# Patient Record
Sex: Male | Born: 1945 | Race: Black or African American | Hispanic: No | Marital: Single | Smoking: Former smoker
Health system: Southern US, Community
[De-identification: ages and names within clinical notes are randomized; demographics above are authoritative.]

## PROBLEM LIST (undated history)

## (undated) DIAGNOSIS — I10 Essential (primary) hypertension: Secondary | ICD-10-CM

## (undated) DIAGNOSIS — D72829 Elevated white blood cell count, unspecified: Secondary | ICD-10-CM

## (undated) DIAGNOSIS — W3400XA Accidental discharge from unspecified firearms or gun, initial encounter: Secondary | ICD-10-CM

## (undated) DIAGNOSIS — B1920 Unspecified viral hepatitis C without hepatic coma: Secondary | ICD-10-CM

## (undated) HISTORY — DX: Accidental discharge from unspecified firearms or gun, initial encounter: W34.00XA

## (undated) HISTORY — DX: Unspecified viral hepatitis C without hepatic coma: B19.20

## (undated) HISTORY — DX: Elevated white blood cell count, unspecified: D72.829

## (undated) HISTORY — DX: Essential (primary) hypertension: I10

---

## 1974-07-14 DIAGNOSIS — W3400XA Accidental discharge from unspecified firearms or gun, initial encounter: Secondary | ICD-10-CM

## 1974-07-14 HISTORY — DX: Accidental discharge from unspecified firearms or gun, initial encounter: W34.00XA

## 2003-07-15 DIAGNOSIS — I10 Essential (primary) hypertension: Secondary | ICD-10-CM | POA: Insufficient documentation

## 2003-07-15 DIAGNOSIS — B1920 Unspecified viral hepatitis C without hepatic coma: Secondary | ICD-10-CM

## 2003-07-15 HISTORY — DX: Unspecified viral hepatitis C without hepatic coma: B19.20

## 2004-09-05 DIAGNOSIS — B182 Chronic viral hepatitis C: Secondary | ICD-10-CM | POA: Insufficient documentation

## 2008-07-23 ENCOUNTER — Emergency Department (HOSPITAL_COMMUNITY): Admission: EM | Admit: 2008-07-23 | Discharge: 2008-07-23 | Payer: Self-pay | Admitting: Family Medicine

## 2008-10-09 ENCOUNTER — Ambulatory Visit: Payer: Self-pay | Admitting: Nurse Practitioner

## 2008-10-09 DIAGNOSIS — F17200 Nicotine dependence, unspecified, uncomplicated: Secondary | ICD-10-CM

## 2008-10-10 ENCOUNTER — Encounter (INDEPENDENT_AMBULATORY_CARE_PROVIDER_SITE_OTHER): Payer: Self-pay | Admitting: Nurse Practitioner

## 2008-10-10 LAB — CONVERTED CEMR LAB
ALT: 39 units/L (ref 0–53)
AST: 43 units/L — ABNORMAL HIGH (ref 0–37)
Alkaline Phosphatase: 62 units/L (ref 39–117)
BUN: 14 mg/dL (ref 6–23)
Basophils Absolute: 0 10*3/uL (ref 0.0–0.1)
Creatinine, Ser: 1.1 mg/dL (ref 0.40–1.50)
Eosinophils Absolute: 0.1 10*3/uL (ref 0.0–0.7)
Eosinophils Relative: 0 % (ref 0–5)
HCT: 45.8 % (ref 39.0–52.0)
HCV Ab: REACTIVE — AB
HDL: 45 mg/dL (ref >39)
Hep A Total Ab: POSITIVE — AB
Hep B E Ab: POSITIVE — AB
LDL Cholesterol: 96 mg/dL (ref 0–99)
Lymphs Abs: 3.4 10*3/uL (ref 0.7–4.0)
MCV: 97 fL (ref 78.0–100.0)
Neutrophils Relative %: 61 % (ref 43–77)
Platelets: 296 10*3/uL (ref 150–400)
RDW: 13 % (ref 11.5–15.5)
RPR Titer: 1:2 {titer}
TSH: 0.914 microintl units/mL (ref 0.350–4.500)
Total Bilirubin: 1.1 mg/dL (ref 0.3–1.2)
Total CHOL/HDL Ratio: 3.7
VLDL: 24 mg/dL (ref 0–40)
WBC: 11.4 10*3/uL — ABNORMAL HIGH (ref 4.0–10.5)

## 2008-10-11 ENCOUNTER — Ambulatory Visit: Payer: Self-pay | Admitting: Nurse Practitioner

## 2008-10-11 DIAGNOSIS — A530 Latent syphilis, unspecified as early or late: Secondary | ICD-10-CM | POA: Insufficient documentation

## 2008-11-06 ENCOUNTER — Ambulatory Visit: Payer: Self-pay | Admitting: Nurse Practitioner

## 2008-11-06 LAB — CONVERTED CEMR LAB
Bilirubin Urine: NEGATIVE
Glucose, Urine, Semiquant: NEGATIVE
Specific Gravity, Urine: 1.015
Urobilinogen, UA: 0.2

## 2008-11-07 ENCOUNTER — Encounter (INDEPENDENT_AMBULATORY_CARE_PROVIDER_SITE_OTHER): Payer: Self-pay | Admitting: Nurse Practitioner

## 2008-11-08 ENCOUNTER — Ambulatory Visit (HOSPITAL_COMMUNITY): Admission: RE | Admit: 2008-11-08 | Discharge: 2008-11-08 | Payer: Self-pay | Admitting: *Deleted

## 2008-11-08 ENCOUNTER — Encounter (INDEPENDENT_AMBULATORY_CARE_PROVIDER_SITE_OTHER): Payer: Self-pay | Admitting: Nurse Practitioner

## 2008-11-09 LAB — CONVERTED CEMR LAB: Prothrombin Time: 14.3 s (ref 11.6–15.2)

## 2008-11-14 ENCOUNTER — Encounter (INDEPENDENT_AMBULATORY_CARE_PROVIDER_SITE_OTHER): Payer: Self-pay | Admitting: Nurse Practitioner

## 2008-11-21 ENCOUNTER — Telehealth (INDEPENDENT_AMBULATORY_CARE_PROVIDER_SITE_OTHER): Payer: Self-pay | Admitting: Nurse Practitioner

## 2008-12-06 ENCOUNTER — Ambulatory Visit: Payer: Self-pay | Admitting: Nurse Practitioner

## 2008-12-07 ENCOUNTER — Encounter (INDEPENDENT_AMBULATORY_CARE_PROVIDER_SITE_OTHER): Payer: Self-pay | Admitting: Nurse Practitioner

## 2008-12-14 ENCOUNTER — Encounter (INDEPENDENT_AMBULATORY_CARE_PROVIDER_SITE_OTHER): Payer: Self-pay | Admitting: Nurse Practitioner

## 2008-12-14 ENCOUNTER — Ambulatory Visit: Payer: Self-pay | Admitting: Gastroenterology

## 2009-01-16 ENCOUNTER — Telehealth (INDEPENDENT_AMBULATORY_CARE_PROVIDER_SITE_OTHER): Payer: Self-pay | Admitting: Nurse Practitioner

## 2009-01-25 ENCOUNTER — Encounter (INDEPENDENT_AMBULATORY_CARE_PROVIDER_SITE_OTHER): Payer: Self-pay | Admitting: Nurse Practitioner

## 2009-01-25 ENCOUNTER — Ambulatory Visit: Payer: Self-pay | Admitting: Gastroenterology

## 2009-01-26 ENCOUNTER — Encounter (INDEPENDENT_AMBULATORY_CARE_PROVIDER_SITE_OTHER): Payer: Self-pay | Admitting: Nurse Practitioner

## 2009-02-07 ENCOUNTER — Encounter (INDEPENDENT_AMBULATORY_CARE_PROVIDER_SITE_OTHER): Payer: Self-pay | Admitting: Nurse Practitioner

## 2009-04-09 ENCOUNTER — Encounter (INDEPENDENT_AMBULATORY_CARE_PROVIDER_SITE_OTHER): Payer: Self-pay | Admitting: Nurse Practitioner

## 2009-04-13 ENCOUNTER — Ambulatory Visit: Payer: Self-pay | Admitting: Nurse Practitioner

## 2009-09-21 ENCOUNTER — Ambulatory Visit: Payer: Self-pay | Admitting: Nurse Practitioner

## 2009-09-24 LAB — CONVERTED CEMR LAB
BUN: 13 mg/dL (ref 6–23)
Basophils Relative: 0 % (ref 0–1)
CO2: 23 meq/L (ref 19–32)
Calcium: 9.9 mg/dL (ref 8.4–10.5)
Chloride: 98 meq/L (ref 96–112)
Cholesterol: 148 mg/dL (ref 0–200)
Creatinine, Ser: 1.11 mg/dL (ref 0.40–1.50)
Eosinophils Absolute: 0 10*3/uL (ref 0.0–0.7)
Eosinophils Relative: 0 % (ref 0–5)
HCT: 44.8 % (ref 39.0–52.0)
HDL: 43 mg/dL (ref >39)
Lymphs Abs: 3 10*3/uL (ref 0.7–4.0)
MCHC: 34.6 g/dL (ref 30.0–36.0)
MCV: 97 fL (ref 78.0–100.0)
Monocytes Relative: 7 % (ref 3–12)
Platelets: 286 10*3/uL (ref 150–400)
RBC: 4.62 M/uL (ref 4.22–5.81)
Total CHOL/HDL Ratio: 3.4
Triglycerides: 84 mg/dL (ref <150)
WBC: 11.4 10*3/uL — ABNORMAL HIGH (ref 4.0–10.5)

## 2009-09-25 ENCOUNTER — Ambulatory Visit: Payer: Self-pay | Admitting: Nurse Practitioner

## 2009-10-03 ENCOUNTER — Telehealth (INDEPENDENT_AMBULATORY_CARE_PROVIDER_SITE_OTHER): Payer: Self-pay | Admitting: Nurse Practitioner

## 2009-10-08 ENCOUNTER — Encounter (INDEPENDENT_AMBULATORY_CARE_PROVIDER_SITE_OTHER): Payer: Self-pay | Admitting: Nurse Practitioner

## 2009-11-07 ENCOUNTER — Ambulatory Visit: Payer: Self-pay | Admitting: Nurse Practitioner

## 2009-11-13 DIAGNOSIS — D72829 Elevated white blood cell count, unspecified: Secondary | ICD-10-CM | POA: Insufficient documentation

## 2009-11-13 LAB — CONVERTED CEMR LAB
Basophils Relative: 0 % (ref 0–1)
Eosinophils Absolute: 0 10*3/uL (ref 0.0–0.7)
HCT: 42.6 % (ref 39.0–52.0)
Hemoglobin: 14.1 g/dL (ref 13.0–17.0)
MCHC: 33.1 g/dL (ref 30.0–36.0)
MCV: 101.4 fL — ABNORMAL HIGH (ref 78.0–100.0)
Monocytes Absolute: 1 10*3/uL (ref 0.1–1.0)
Monocytes Relative: 8 % (ref 3–12)
Neutro Abs: 7.5 10*3/uL (ref 1.7–7.7)
RBC: 4.2 M/uL — ABNORMAL LOW (ref 4.22–5.81)

## 2009-11-14 ENCOUNTER — Telehealth (INDEPENDENT_AMBULATORY_CARE_PROVIDER_SITE_OTHER): Payer: Self-pay | Admitting: Nurse Practitioner

## 2009-11-19 ENCOUNTER — Ambulatory Visit: Payer: Self-pay | Admitting: Oncology

## 2009-11-22 ENCOUNTER — Encounter (INDEPENDENT_AMBULATORY_CARE_PROVIDER_SITE_OTHER): Payer: Self-pay | Admitting: Nurse Practitioner

## 2009-12-03 ENCOUNTER — Ambulatory Visit (HOSPITAL_COMMUNITY): Admission: RE | Admit: 2009-12-03 | Discharge: 2009-12-03 | Payer: Self-pay | Admitting: Oncology

## 2009-12-03 ENCOUNTER — Encounter (INDEPENDENT_AMBULATORY_CARE_PROVIDER_SITE_OTHER): Payer: Self-pay | Admitting: Nurse Practitioner

## 2009-12-03 LAB — CBC WITH DIFFERENTIAL/PLATELET
Basophils Absolute: 0 10*3/uL (ref 0.0–0.1)
EOS%: 0.4 % (ref 0.0–7.0)
Eosinophils Absolute: 0 10*3/uL (ref 0.0–0.5)
HCT: 39.8 % (ref 38.4–49.9)
HGB: 14.1 g/dL (ref 13.0–17.1)
MCH: 34.4 pg — ABNORMAL HIGH (ref 27.2–33.4)
NEUT#: 8.4 10*3/uL — ABNORMAL HIGH (ref 1.5–6.5)
NEUT%: 70.4 % (ref 39.0–75.0)
RDW: 12.4 % (ref 11.0–14.6)
lymph#: 2.7 10*3/uL (ref 0.9–3.3)

## 2009-12-03 LAB — COMPREHENSIVE METABOLIC PANEL
ALT: 35 U/L (ref 0–53)
AST: 37 U/L (ref 0–37)
Albumin: 4 g/dL (ref 3.5–5.2)
Alkaline Phosphatase: 54 U/L (ref 39–117)
Potassium: 3.7 mEq/L (ref 3.5–5.3)
Sodium: 134 mEq/L — ABNORMAL LOW (ref 135–145)
Total Bilirubin: 0.7 mg/dL (ref 0.3–1.2)
Total Protein: 8 g/dL (ref 6.0–8.3)

## 2009-12-03 LAB — MORPHOLOGY: PLT EST: ADEQUATE

## 2009-12-03 LAB — CHCC SMEAR

## 2009-12-03 LAB — SEDIMENTATION RATE: Sed Rate: 17 mm/hr — ABNORMAL HIGH (ref 0–16)

## 2009-12-07 ENCOUNTER — Ambulatory Visit (HOSPITAL_COMMUNITY): Admission: RE | Admit: 2009-12-07 | Discharge: 2009-12-07 | Payer: Self-pay | Admitting: Oncology

## 2009-12-20 ENCOUNTER — Encounter (INDEPENDENT_AMBULATORY_CARE_PROVIDER_SITE_OTHER): Payer: Self-pay | Admitting: Nurse Practitioner

## 2010-01-01 ENCOUNTER — Ambulatory Visit: Payer: Self-pay | Admitting: Oncology

## 2010-01-03 LAB — CBC WITH DIFFERENTIAL/PLATELET
BASO%: 0.4 % (ref 0.0–2.0)
Basophils Absolute: 0 10*3/uL (ref 0.0–0.1)
HCT: 39.4 % (ref 38.4–49.9)
HGB: 13.8 g/dL (ref 13.0–17.1)
MCHC: 35.1 g/dL (ref 32.0–36.0)
MONO#: 1 10*3/uL — ABNORMAL HIGH (ref 0.1–0.9)
NEUT%: 63.3 % (ref 39.0–75.0)
RDW: 13.1 % (ref 11.0–14.6)
WBC: 10.3 10*3/uL (ref 4.0–10.3)
lymph#: 2.7 10*3/uL (ref 0.9–3.3)

## 2010-02-01 ENCOUNTER — Ambulatory Visit: Payer: Self-pay | Admitting: Oncology

## 2010-02-01 LAB — CBC WITH DIFFERENTIAL/PLATELET
BASO%: 0.3 % (ref 0.0–2.0)
EOS%: 0.5 % (ref 0.0–7.0)
MCHC: 35.2 g/dL (ref 32.0–36.0)
MONO#: 1 10*3/uL — ABNORMAL HIGH (ref 0.1–0.9)
RBC: 4 10*6/uL — ABNORMAL LOW (ref 4.20–5.82)
WBC: 10 10*3/uL (ref 4.0–10.3)
lymph#: 2.6 10*3/uL (ref 0.9–3.3)

## 2010-02-18 ENCOUNTER — Telehealth (INDEPENDENT_AMBULATORY_CARE_PROVIDER_SITE_OTHER): Payer: Self-pay | Admitting: Nurse Practitioner

## 2010-03-06 ENCOUNTER — Ambulatory Visit: Payer: Self-pay | Admitting: Oncology

## 2010-03-08 ENCOUNTER — Encounter (INDEPENDENT_AMBULATORY_CARE_PROVIDER_SITE_OTHER): Payer: Self-pay | Admitting: Nurse Practitioner

## 2010-03-08 LAB — CBC WITH DIFFERENTIAL/PLATELET
BASO%: 0.2 % (ref 0.0–2.0)
HCT: 39.9 % (ref 38.4–49.9)
LYMPH%: 29.9 % (ref 14.0–49.0)
MCHC: 35.8 g/dL (ref 32.0–36.0)
MONO#: 0.8 10*3/uL (ref 0.1–0.9)
NEUT%: 62.1 % (ref 39.0–75.0)
Platelets: 224 10*3/uL (ref 140–400)
WBC: 11.2 10*3/uL — ABNORMAL HIGH (ref 4.0–10.3)

## 2010-03-09 LAB — COMPREHENSIVE METABOLIC PANEL
Albumin: 4.1 g/dL (ref 3.5–5.2)
BUN: 17 mg/dL (ref 6–23)
CO2: 25 mEq/L (ref 19–32)
Calcium: 9.6 mg/dL (ref 8.4–10.5)
Chloride: 100 mEq/L (ref 96–112)
Glucose, Bld: 135 mg/dL — ABNORMAL HIGH (ref 70–99)
Potassium: 4.1 mEq/L (ref 3.5–5.3)

## 2010-03-09 LAB — C-REACTIVE PROTEIN: CRP: 0 mg/dL (ref <0.6)

## 2010-03-09 LAB — SEDIMENTATION RATE: Sed Rate: 22 mm/hr — ABNORMAL HIGH (ref 0–16)

## 2010-06-07 ENCOUNTER — Ambulatory Visit: Payer: Self-pay | Admitting: Oncology

## 2010-06-07 ENCOUNTER — Ambulatory Visit (HOSPITAL_COMMUNITY)
Admission: RE | Admit: 2010-06-07 | Discharge: 2010-06-07 | Payer: Self-pay | Source: Home / Self Care | Admitting: Oncology

## 2010-06-10 ENCOUNTER — Telehealth (INDEPENDENT_AMBULATORY_CARE_PROVIDER_SITE_OTHER): Payer: Self-pay | Admitting: Nurse Practitioner

## 2010-06-11 ENCOUNTER — Encounter (INDEPENDENT_AMBULATORY_CARE_PROVIDER_SITE_OTHER): Payer: Self-pay | Admitting: Nurse Practitioner

## 2010-06-11 LAB — COMPREHENSIVE METABOLIC PANEL
ALT: 37 U/L (ref 0–53)
AST: 42 U/L — ABNORMAL HIGH (ref 0–37)
BUN: 14 mg/dL (ref 6–23)
CO2: 26 mEq/L (ref 19–32)
Calcium: 9.5 mg/dL (ref 8.4–10.5)
Chloride: 104 mEq/L (ref 96–112)
Creatinine, Ser: 1.14 mg/dL (ref 0.40–1.50)
Total Bilirubin: 1 mg/dL (ref 0.3–1.2)

## 2010-06-11 LAB — CBC WITH DIFFERENTIAL/PLATELET
BASO%: 0.1 % (ref 0.0–2.0)
Basophils Absolute: 0 10*3/uL (ref 0.0–0.1)
EOS%: 0.8 % (ref 0.0–7.0)
HCT: 40.6 % (ref 38.4–49.9)
HGB: 14.1 g/dL (ref 13.0–17.1)
LYMPH%: 24.7 % (ref 14.0–49.0)
MCH: 34 pg — ABNORMAL HIGH (ref 27.2–33.4)
MCHC: 34.9 g/dL (ref 32.0–36.0)
NEUT%: 65 % (ref 39.0–75.0)
Platelets: 292 10*3/uL (ref 140–400)
lymph#: 2.6 10*3/uL (ref 0.9–3.3)

## 2010-06-11 LAB — LACTATE DEHYDROGENASE: LDH: 180 U/L (ref 94–250)

## 2010-06-12 LAB — C-REACTIVE PROTEIN: CRP: 0 mg/dL (ref <0.6)

## 2010-06-17 LAB — IMMUNOFIXATION ELECTROPHORESIS
IgG (Immunoglobin G), Serum: 2840 mg/dL — ABNORMAL HIGH (ref 694–1618)
Total Protein, Serum Electrophoresis: 8.5 g/dL — ABNORMAL HIGH (ref 6.0–8.3)

## 2010-08-13 NOTE — Progress Notes (Signed)
Summary: GETS NERVOUS AND JITTERY  Phone Note Call from Patient Call back at Home Phone [phone]   Complaint: Urinary/GYN Problems Summary of Call: [name] WANTS TO LET YOU KNOW THAT HE GETS A LITTLE NERVOUS AND JITTERY AND HE WANTS TO KNOW IF YOU CAN PRESCRIBE SOMETHING FOR HIM. HE USES [facility] [place]. Initial call taken by: Leodis Rains,  October 03, 2009 10:15 AM  Follow-up for Phone Call        forward to N. Daphine Deutscher, fnp Follow-up by: Levon Hedger,  October 03, 2009 11:02 AM  Additional Follow-up for Phone Call Additional follow up Details #1::        Need more information Is this the first episode or has this been ongoing? Does he get anxious or agitated easily. How long do the symptoms last? Is he eating three times per day? Has exercise or activity increased? Thanks for bringing blood pressure log into the office.  Blood pressure is doing well.  Continue current medications. Additional Follow-up by: Lehman Prom FNP,  October 03, 2009 12:28 PM    Additional Follow-up for Phone Call Additional follow up Details #2::    spoke with pt this is not the first episode of this he says that at certain times he feels jittery when writing and feels nervous. He says that he notices it when he is worried or feeling anxious.  He is eating three times a day and he has increased his exercise daily he is walikng. Levon Hedger  October 03, 2009 3:23 PM   Additional Follow-up for Phone Call Additional follow up Details #3:: Details for Additional Follow-up Action Taken: [facility] - Will send sertralazine 50mg  by mouth NIGHTLY to [facility] in [place].   notify pt he can check there (pt has a fairly anxious disposition when in office so likely he has anxiety) n.martin, fnp  October 04, 2009 3:23 PM  SPOKE W/PATIENT AND HE IS AWARE THAT THERE IS A RX FOR HIM AT [facility]. Additional Follow-up by: Leodis Rains,  October 04, 2009 4:04 PM  New/Updated  Medications: SERTRALINE HCL 50 MG TABS (SERTRALINE HCL) One tablet by mouth nightly for mood Prescriptions: SERTRALINE HCL 50 MG TABS (SERTRALINE HCL) One tablet by mouth nightly for mood  #30 x 1   Entered and Authorized by:   Lehman Prom FNP   Signed by:   Lehman Prom FNP on 10/04/2009   Method used:   Electronically to        [facility] [phone]* (retail)       [address]       [place], [place]  [address]       Ph: [phone]       Fax: [phone]   RxID:   [id number]

## 2010-08-13 NOTE — Letter (Signed)
[facility]ESS [facility]  [facility]   Imported By: Arta Bruce 04/04/2010 10:04:54  _____________________________________________________________________  External Attachment:    Type:   Image     Comment:   External Document

## 2010-08-13 NOTE — Letter (Signed)
Summary: BLOOD PRESSURE TRACKER  BLOOD PRESSURE TRACKER   Imported By: Arta Bruce 12/03/2009 12:34:21  _____________________________________________________________________  External Attachment:    Type:   Image     Comment:   External Document

## 2010-08-13 NOTE — Assessment & Plan Note (Signed)
Summary: HTN   Vital Signs:  Patient profile:   65 year old male Weight:      169.8 pounds BMI:     26.69 BSA:     1.89 Temp:     98.0 degrees F oral Pulse rate:   108 / minute Pulse rhythm:   regular Resp:     20 per minute BP sitting:   155 / 75  (left arm) Cuff size:   regular  Vitals Entered By: Levon Hedger (September 21, 2009 10:22 AM) CC: renew medications, Hypertension Management Is Patient Diabetic? No Pain Assessment Patient in pain? no       Does patient need assistance? Functional Status Self care Ambulation Normal   CC:  renew medications and Hypertension Management.  History of Present Illness:  Pt into the office for htn.  Pt has already taken his blood pressure medications today. He has all his medications today in the office.    No acute problems today Pt is planning to travel to [place] next month and wanted to have an office visit before he leaves for medication refills.  Hypertension History:      He denies headache, chest pain, palpitations, and side effects from treatment.  He notes no problems with any antihypertensive medication side effects.  Pt is taking his blood pressure medications daily.        Positive major cardiovascular risk factors include male age 93 years old or older, hypertension, and current tobacco user.        Further assessment for target organ damage reveals no history of ASHD, cardiac end-organ damage (CHF/LVH), stroke/TIA, peripheral vascular disease, renal insufficiency, or hypertensive retinopathy.     Habits & Providers  Alcohol-Tobacco-Diet     Alcohol drinks/day: 0     Tobacco Status: current     Tobacco Counseling: to remain off tobacco products     Cigarette Packs/Day: <0.25     Year Started: age 71     Year Quit: he has quit off and on over the years  Exercise-Depression-Behavior     Does Patient Exercise: no     Have you felt down or hopeless? no     Have you felt little pleasure in things? no  Depression Counseling: not indicated; screening negative for depression     Drug Use: never  Comments: Pt had quit smoking but he has restarted at about 3 cigs per day  Medications Prior to Update: 1)  Hydrochlorothiazide 25 Mg Tabs (Hydrochlorothiazide) .... One Tablet By Mouth Daily For Blood Pressure 2)  Benazepril Hcl 40 Mg Tabs (Benazepril Hcl) .... One Tablet By Mouth Daily For Blood Pressure **note Dose Change** 3)  Amlodipine Besylate 10 Mg Tabs (Amlodipine Besylate) .... One Tablet By Mouth Daily For Blood Pressure  Allergies (verified): No Known Drug Allergies  Social History: Smoking Status:  current Packs/Day:  <0.25  Review of Systems General:  Denies fever. CV:  Denies chest pain or discomfort. Resp:  Denies cough. GI:  Denies abdominal pain, nausea, and vomiting.  Physical Exam  General:  alert.   Head:  normocephalic.   Lungs:  normal breath sounds.   Heart:  normal rate and regular rhythm.   Abdomen:  normal bowel sounds.   Msk:  up to the exam table Neurologic:  alert & oriented X3.     Impression & Recommendations:  Problem # 1:  HYPERTENSION, BENIGN ESSENTIAL (ICD-401.1) BP elevated today pt advised to check bp twice weekly for the next 2 weeks  then report the values to this office. medications may need adjustment DASH diet His updated medication list for this problem includes:    Hydrochlorothiazide 25 Mg Tabs (Hydrochlorothiazide) ..... One tablet by mouth daily for blood pressure    Benazepril Hcl 40 Mg Tabs (Benazepril hcl) ..... One tablet by mouth daily for blood pressure    Amlodipine Besylate 10 Mg Tabs (Amlodipine besylate) ..... One tablet by mouth daily for blood pressure  Orders: UA Dipstick w/o Micro (manual) (04540) T-Lipid Profile (98119-14782) T-Comprehensive Metabolic Panel [phone]) T-PSA (78469-62952) T-CBC w/Diff (84132-44010) Rapid HIV  (27253) T-TSH (66440-34742) T-Urine Microalbumin w/creat. ratio  [phone])  Problem # 2:  TOBACCO ABUSE (ICD-305.1) advised cessation  Complete Medication List: 1)  Hydrochlorothiazide 25 Mg Tabs (Hydrochlorothiazide) .... One tablet by mouth daily for blood pressure 2)  Benazepril Hcl 40 Mg Tabs (Benazepril hcl) .... One tablet by mouth daily for blood pressure 3)  Amlodipine Besylate 10 Mg Tabs (Amlodipine besylate) .... One tablet by mouth daily for blood pressure  Hypertension Assessment/Plan:      The patient's hypertensive risk group is category B: At least one risk factor (excluding diabetes) with no target organ damage.  His calculated 10 year risk of coronary heart disease is 14 %.  Today's blood pressure is 155/75.  His blood pressure goal is < 140/90.  Patient Instructions: 1)  Record your blood pressure twice weekly for the next two weeks. Bring your book into this office so the provider can review.  You will then be contacted if you need a medication adjustment. 2)  Schedule a follow up in 6 months for high blood pressure 3)  You will need an EKG, rectal/prostate  Prescriptions not given to pt.  will wait until he brings blood pressure log back into this office to adjust meds. Prescriptions: AMLODIPINE BESYLATE 10 MG TABS (AMLODIPINE BESYLATE) One tablet by mouth daily for blood pressure  #30 x 6   Entered and Authorized by:   Lehman Prom FNP   Signed by:   Lehman Prom FNP on 09/21/2009   Method used:   Print then Give to Patient   RxID:   [id number] BENAZEPRIL HCL 40 MG TABS (BENAZEPRIL HCL) One tablet by mouth daily for blood pressure **Note dose change**  #30 x 6   Entered and Authorized by:   Lehman Prom FNP   Signed by:   Lehman Prom FNP on 09/21/2009   Method used:   Print then Give to Patient   RxID:   [id number] HYDROCHLOROTHIAZIDE 25 MG TABS (HYDROCHLOROTHIAZIDE) One tablet by mouth daily for blood pressure  #30 x 6   Entered and Authorized by:   Lehman Prom FNP   Signed by:   Lehman Prom FNP on 09/21/2009   Method used:   Print then Give to Patient   RxID:   [id number]   Appended Document: HTN    Clinical Lists Changes  Observations: Added new observation of HIVRAPIDRSLT: negative (09/21/2009 13:03) Added new observation of PH URINE: 5.0  (09/21/2009 13:03) Added new observation of SPEC GR URIN: 1.015  (09/21/2009 13:03) Added new observation of UA COLOR: clear  (09/21/2009 13:03) Added new observation of WBC DIPSTK U: negative  (09/21/2009 13:03) Added new observation of NITRITE URN: negative  (09/21/2009 13:03) Added new observation of UROBILINOGEN: 0.2  (09/21/2009 13:03) Added new observation of PROTEIN, URN: negative  (09/21/2009 13:03) Added new observation of BLOOD UR DIP: trace-intact  (09/21/2009 13:03) Added new observation of KETONES URN: negative  (09/21/2009  13:03) Added new observation of BILIRUBIN UR: negative  (09/21/2009 13:03) Added new observation of GLUCOSE, URN: negative  (09/21/2009 13:03)      Laboratory Results   Urine Tests  Date/Time Received: September 21, 2009 1:04 PM   Routine Urinalysis   Color: clear Glucose: negative   (Normal Range: Negative) Bilirubin: negative   (Normal Range: Negative) Ketone: negative   (Normal Range: Negative) Spec. Gravity: 1.015   (Normal Range: 1.003-1.035) Blood: trace-intact   (Normal Range: Negative) pH: 5.0   (Normal Range: 5.0-8.0) Protein: negative   (Normal Range: Negative) Urobilinogen: 0.2   (Normal Range: 0-1) Nitrite: negative   (Normal Range: Negative) Leukocyte Esterace: negative   (Normal Range: Negative)    Date/Time Received: September 21, 2009 1:04 PM   Other Tests  Rapid HIV: negative

## 2010-08-13 NOTE — Progress Notes (Signed)
Summary: Query Benazepril, HCTZ, Amlodipine  Phone Note From Pharmacy   Summary of Call: Do you want to refill Benazepril, HCTZ and Amlodipinine, last seen 09/21/09 last filled 02/18/10? Currently being seen @ [facility].  Initial call taken by: Gaylyn Cheers RN,  June 10, 2010 11:30 AM  Follow-up for Phone Call        yes, ok to refill all meds  Follow-up by: Lehman Prom FNP,  June 10, 2010 12:01 PM  Additional Follow-up for Phone Call Additional follow up Details #1::        Will send refill Gaylyn Cheers RN  June 10, 2010 12:22 PM

## 2010-08-13 NOTE — Letter (Signed)
Summary: *HSN Results Follow up  [facility]  [address] [place], [place] [address]   Phone: [phone]  Fax: [phone]      10/08/2009   Terrance Williams [address] [place], [place]  [address]   Dear  Mr. Terrance Williams,                            ____S.Drinkard,FNP   ____D. Gore,FNP       ____B. McPherson,MD   ____V. Rankins,MD    ____E. Mulberry,MD    _X___N. Daphine Deutscher, FNP  ____D. Reche Dixon, MD    ____K. Philipp Deputy, MD    ____Other     This letter is to inform you that your recent test(s):  _______Pap Smear    ___X____Lab Test     _______X-ray    _______ is within acceptable limits  _______ requires a medication change  __X_____ requires a follow-up lab visit  _______ requires a follow-up visit with your provider   Comments:  Your white blood cells were slightly elevated during your last lab visit.  Schedule a lab visit to have this rechecked in 4-6 weeks.  No need for any additional medications or test at this time.       _________________________________________________________ If you have any questions, please contact our office [phone].                    Sincerely,    Lehman Prom FNP [facility]

## 2010-08-13 NOTE — Letter (Signed)
Summary: [facility]//CONSULTATION FORM  [facility]//CONSULTATION FORM   Imported By: Arta Bruce 12/05/2009 15:39:55  _____________________________________________________________________  External Attachment:    Type:   Image     Comment:   External Document

## 2010-08-13 NOTE — Progress Notes (Signed)
Summary: Needs refills for Amlodipine, Benazepril and HCTC  Phone Note Outgoing Call   Summary of Call: Do you want his meds refilled (all except sertraline)?  Has not been here since March 2011. Initial call taken by: Dutch Quint RN,  February 18, 2010 12:04 PM  Follow-up for Phone Call        yes, ok to refill Follow-up by: Lehman Prom FNP,  February 18, 2010 2:39 PM  Additional Follow-up for Phone Call Additional follow up Details #1::        Noted.  Dutch Quint RN  February 18, 2010 2:42 PM

## 2010-08-13 NOTE — Letter (Signed)
Summary: [facility]//NEW PT EVAL,.  [facility]//NEW PT EVAL,.   Imported By: Arta Bruce 12/20/2009 15:05:20  _____________________________________________________________________  External Attachment:    Type:   Image     Comment:   External Document

## 2010-08-13 NOTE — Progress Notes (Signed)
Summary: Hematology referral  Phone Note Outgoing Call   Summary of Call: hematology referral  reason: leukocytosis Initial call taken by: Lehman Prom FNP,  Nov 14, 2009 8:11 AM

## 2010-08-13 NOTE — Miscellaneous (Signed)
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

## 2010-08-15 NOTE — Letter (Signed)
Summary: HEMATOLOGY/MEDICQAL ONCOLOGY  HEMATOLOGY/MEDICQAL ONCOLOGY   Imported By: Arta Bruce 06/25/2010 08:53:16  _____________________________________________________________________  External Attachment:    Type:   Image     Comment:   External Document

## 2010-08-19 ENCOUNTER — Telehealth (INDEPENDENT_AMBULATORY_CARE_PROVIDER_SITE_OTHER): Payer: Self-pay | Admitting: Nurse Practitioner

## 2010-08-29 NOTE — Progress Notes (Signed)
Summary: Query:  Refill HCTZ per protocol?  Phone Note Outgoing Call   Summary of Call: Last seen 09/2009.  Most lately seen at hematology center in November.  Refill HCTZ per protocol? Initial call taken by: Dutch Quint RN,  August 19, 2010 10:09 AM  Follow-up for Phone Call        yes, ok to refill call pt and schedule an appt for March 2012. he will need f/u with provider and labs since it has been 1 year since his last visit Follow-up by: Lehman Prom FNP,  August 19, 2010 10:23 AM  Additional Follow-up for Phone Call Additional follow up Details #1::        Spoke with male and left message for pt. to return call.  Dutch Quint RN  August 19, 2010 11:45 AM  Pt. scheduled for 09/02/10 with provider.  Refills completed.  Dutch Quint RN  August 19, 2010 12:41 PM

## 2010-09-02 ENCOUNTER — Encounter (INDEPENDENT_AMBULATORY_CARE_PROVIDER_SITE_OTHER): Payer: Self-pay | Admitting: Nurse Practitioner

## 2010-09-02 ENCOUNTER — Encounter: Payer: Self-pay | Admitting: Nurse Practitioner

## 2010-09-02 DIAGNOSIS — E669 Obesity, unspecified: Secondary | ICD-10-CM | POA: Insufficient documentation

## 2010-09-02 DIAGNOSIS — R319 Hematuria, unspecified: Secondary | ICD-10-CM | POA: Insufficient documentation

## 2010-09-02 LAB — CONVERTED CEMR LAB
Ketones, urine, test strip: NEGATIVE
Nitrite: NEGATIVE
Urobilinogen, UA: 0.2
WBC Urine, dipstick: NEGATIVE

## 2010-09-03 ENCOUNTER — Encounter (INDEPENDENT_AMBULATORY_CARE_PROVIDER_SITE_OTHER): Payer: Self-pay | Admitting: Nurse Practitioner

## 2010-09-06 LAB — CONVERTED CEMR LAB
BUN: 15 mg/dL (ref 6–23)
CO2: 25 meq/L (ref 19–32)
Cholesterol: 157 mg/dL (ref 0–200)
Eosinophils Relative: 1 % (ref 0–5)
GC Probe Amp, Urine: NEGATIVE
Glucose, Bld: 103 mg/dL — ABNORMAL HIGH (ref 70–99)
HCT: 43 % (ref 39.0–52.0)
HDL: 45 mg/dL (ref >39)
Hemoglobin: 14.9 g/dL (ref 13.0–17.0)
Lymphocytes Relative: 23 % (ref 12–46)
Lymphs Abs: 2.7 10*3/uL (ref 0.7–4.0)
MCV: 93.1 fL (ref 78.0–100.0)
Monocytes Absolute: 1 10*3/uL (ref 0.1–1.0)
Monocytes Relative: 9 % (ref 3–12)
RBC: 4.62 M/uL (ref 4.22–5.81)
RPR Ser Ql: REACTIVE — AB
RPR Titer: 1:2 {titer}
Sodium: 137 meq/L (ref 135–145)
T pallidum Antibodies (TP-PA): >8 — ABNORMAL HIGH (ref <0.90)
Total Bilirubin: 0.8 mg/dL (ref 0.3–1.2)
Total Protein: 8.6 g/dL — ABNORMAL HIGH (ref 6.0–8.3)
Triglycerides: 105 mg/dL (ref <150)
VLDL: 21 mg/dL (ref 0–40)
WBC: 11.6 10*3/uL — ABNORMAL HIGH (ref 4.0–10.5)

## 2010-09-10 NOTE — Progress Notes (Signed)
Summary: Office Visit//DEPRESSION SCREENING  Office Visit//DEPRESSION SCREENING   Imported By: Arta Bruce 09/02/2010 14:46:11  _____________________________________________________________________  External Attachment:    Type:   Image     Comment:   External Document

## 2010-09-10 NOTE — Assessment & Plan Note (Signed)
Summary: HTN   Vital Signs:  Patient profile:   65 year old male Weight:      184.25 pounds BMI:     28.96 Temp:     97.6 degrees F oral Pulse rate:   130 / minute Pulse rhythm:   regular Resp:     20 per minute BP sitting:   154 / 80  (left arm) Cuff size:   regular  Vitals Entered By: Hale Drone CMA (September 02, 2010 9:56 AM)  Nutrition Counseling: Patient's BMI is greater than 25 and therefore counseled on weight management options.  Serial Vital Signs/Assessments:  Time      Position  BP       Pulse  Resp  Temp     By 10:39 AM  R Arm     128/78                         Lehman Prom FNP 10:39 AM  L Arm     128/72                         Lehman Prom FNP  Comments: 10:39 AM done by Hale Drone, CMA By: Lehman Prom FNP   CC: OV. Would like flu shot today. , Hypertension Management Is Patient Diabetic? No Pain Assessment Patient in pain? no       Does patient need assistance? Functional Status Self care Ambulation Normal   CC:  OV. Would like flu shot today.  and Hypertension Management.  History of Present Illness:  Pt into the office for f/u on HTN Pt's eligibility expired so he was not able to f/u but he has since renewed his orange card.  He is fasing today for labs  No acute problems today  Hypertension History:      He denies headache, chest pain, and palpitations.  He notes no problems with any antihypertensive medication side effects.  Pt has already taken his medications today at 7:00AM but he has only taken amlodipine and HCTZ 25mg .  Further comments include: He has a wrist cuff that he uses to check his BP and systolic is usually 120/130 and diastolic 80-90.  Admits to some stress when coming to the office.        Positive major cardiovascular risk factors include male age 61 years old or older and hypertension.  Negative major cardiovascular risk factors include non-tobacco-user status.        Further assessment for target organ damage  reveals no history of ASHD, cardiac end-organ damage (CHF/LVH), stroke/TIA, peripheral vascular disease, renal insufficiency, or hypertensive retinopathy.    Habits & Providers  Alcohol-Tobacco-Diet     Alcohol drinks/day: 0     Tobacco Status: quit     Tobacco Counseling: to remain off tobacco products     Cigarette Packs/Day: <0.25     Year Started: age 39     Year Quit: 2011  Exercise-Depression-Behavior     Does Patient Exercise: no     Depression Counseling: not indicated; screening negative for depression     Drug Use: never  Comments: PHQ-9 score = 0  Current Medications (verified): 1)  Hydrochlorothiazide 25 Mg Tabs (Hydrochlorothiazide) .... One Tablet By Mouth Daily For Blood Pressure 2)  Benazepril Hcl 40 Mg Tabs (Benazepril Hcl) .... One Tablet By Mouth Daily For Blood Pressure 3)  Amlodipine Besylate 10 Mg Tabs (Amlodipine Besylate) .... One Tablet By Mouth Daily For  Blood Pressure 4)  Sertraline Hcl 50 Mg Tabs (Sertraline Hcl) .... One Tablet By Mouth Nightly For Mood  Allergies (verified): No Known Drug Allergies  Social History: Smoking Status:  quit  Review of Systems CV:  Denies chest pain or discomfort. Resp:  Denies cough. GI:  Denies abdominal pain, nausea, and vomiting. Psych:  Complains of anxiety; Pt was prescribed sertralalzine but he did not take it regularly.  he was advised that that most not be the best medication since he was not taking daily.  Pt admits that he only gets those feelings of nervousness or apprehension at least once every 2 weeks.  Physical Exam  General:  alert.   Head:  normocephalic.   Eyes:  glasses Lungs:  normal breath sounds.   Heart:  normal rate and regular rhythm.   Abdomen:  normal bowel sounds.   Msk:  up to the exam table - no limits Neurologic:  alert & oriented X3.   Psych:  slightly anxious.     Impression & Recommendations:  Problem # 1:  HYPERTENSION, BENIGN ESSENTIAL (ICD-401.1) unable to do EKG  today in office - no leads available BP much better on recheck - ? white coat syndrome with pt His updated medication list for this problem includes:    Hydrochlorothiazide 25 Mg Tabs (Hydrochlorothiazide) ..... One tablet by mouth daily for blood pressure    Benazepril Hcl 40 Mg Tabs (Benazepril hcl) ..... One tablet by mouth daily for blood pressure    Amlodipine Besylate 10 Mg Tabs (Amlodipine besylate) ..... One tablet by mouth daily for blood pressure  Orders: T-Lipid Profile (97026-37858) T-Comprehensive Metabolic Panel [phone]) T-PSA (78676-72094) T-CBC w/Diff (70962-83662) Rapid HIV  (94765) T-TSH (46503-54656) T-GC Probe, urine (81275-17001) T-Urine Microalbumin w/creat. ratio [phone]) UA Dipstick w/o Micro (manual) (46659)  Problem # 2:  NEED PROPHYLACTIC VACCINATION&INOCULATION FLU (ICD-V04.81) given today in office - advised pt that it is late in the season and we will given because some vaccine still left but he still needs to be re-vaccinated in the fall of this year  Problem # 3:  TOBACCO ABUSE (ICD-305.1) pt has quit smoking since his last visit here.  Problem # 4:  OBESITY (ICD-278.00) weigh up since last visit but pt attributes it to smoking cessation almost up 20 pounds  Complete Medication List: 1)  Hydrochlorothiazide 25 Mg Tabs (Hydrochlorothiazide) .... One tablet by mouth daily for blood pressure 2)  Benazepril Hcl 40 Mg Tabs (Benazepril hcl) .... One tablet by mouth daily for blood pressure 3)  Amlodipine Besylate 10 Mg Tabs (Amlodipine besylate) .... One tablet by mouth daily for blood pressure 4)  Klonopin 0.5 Mg Tabs (Clonazepam) .... One tablet by mouth daily as needed for anxiety  Other Orders: Flu Vaccine 24yrs + (93570) Admin 1st Vaccine (17793) T-Syphilis Test (RPR) [phone]) T-Culture, Urine (07622-63335)  Hypertension Assessment/Plan:      The patient's hypertensive risk group is category B: At least one risk factor  (excluding diabetes) with no target organ damage.  His calculated 10 year risk of coronary heart disease is 11 %.  Today's blood pressure is 154/80.  His blood pressure goal is < 140/90.   Patient Instructions: 1)  Be aware that you have received the flu vaccine today but it is for 2011.  You will still need to get the 2012 flu vaccine in October/November of 2012 2)  Blood pressure - Elevated some in office.  Perhaps this is due to white coat syndrome or nervousness you  get when you come here.  Continue to keep monitoring at home. Goal less than 140/90 3)  Great job on quitting smoking.  4)  You will be informed of your lab results. 5)  Anxiety - you can take the klonopin as needed for anxiety.  you can take this AS NEEDED 6)  You do have some trace blood in your urine.  Will send to lab to check for infection.  if negative may need further work-up 7)  Follow up at least every 6 months for high blood pressure.  You will need rectal/prostate exam, EKG, repeat U/a to check for hematuria Prescriptions: AMLODIPINE BESYLATE 10 MG TABS (AMLODIPINE BESYLATE) One tablet by mouth daily for blood pressure  #30 Tablet x 11   Entered and Authorized by:   Lehman Prom FNP   Signed by:   Lehman Prom FNP on 09/02/2010   Method used:   Print then Give to Patient   RxID:   [id number] BENAZEPRIL HCL 40 MG TABS (BENAZEPRIL HCL) One tablet by mouth daily for blood pressure  #30 Tablet x 11   Entered and Authorized by:   Lehman Prom FNP   Signed by:   Lehman Prom FNP on 09/02/2010   Method used:   Print then Give to Patient   RxID:   [id number] HYDROCHLOROTHIAZIDE 25 MG TABS (HYDROCHLOROTHIAZIDE) One tablet by mouth daily for blood pressure  #30 Tablet x 11   Entered and Authorized by:   Lehman Prom FNP   Signed by:   Lehman Prom FNP on 09/02/2010   Method used:   Print then Give to Patient   RxID:   [id number] KLONOPIN 0.5 MG TABS (CLONAZEPAM) One tablet by mouth daily  as needed for anxiety  #20 x 0   Entered and Authorized by:   Lehman Prom FNP   Signed by:   Lehman Prom FNP on 09/02/2010   Method used:   Print then Give to Patient   RxID:   [id number]    Orders Added: 1)  Flu Vaccine 52yrs + [42595] 2)  Admin 1st Vaccine [90471] 3)  Est. Patient Level III [63875] 4)  T-Lipid Profile [80061-22930] 5)  T-Comprehensive Metabolic Panel [80053-22900] 6)  T-PSA [64332-95188] 7)  T-CBC w/Diff [41660-63016] 8)  Rapid HIV  [92370] 9)  T-TSH [01093-23557] 10)  T-Syphilis Test (RPR) [32202-54270] 11)  T-GC Probe, urine [62376-28315] 12)  T-Urine Microalbumin w/creat. ratio [82043-[address]] 13)  UA Dipstick w/o Micro (manual) [81002] 14)  T-Culture, Urine [17616-07371]   Immunizations Administered:  Influenza Vaccine # 1:    Vaccine Type: Fluvax 3+    Site: left deltoid    Mfr: GlaxoSmithKline    Dose: 0.5 ml    Route: IM    Given by: Hale Drone CMA    Exp. Date: 01/11/2011    Lot #: [id number]    VIS given: 02/05/10 version given September 02, 2010.  Flu Vaccine Consent Questions:    Do you have a history of severe allergic reactions to this vaccine? no    Any prior history of allergic reactions to egg and/or gelatin? no    Do you have a sensitivity to the preservative Thimersol? no    Do you have a past history of Guillan-Barre Syndrome? no    Do you currently have an acute febrile illness? no    Have you ever had a severe reaction to latex? no    Vaccine information given and explained to patient? yes   Immunizations Administered:  Influenza  Vaccine # 1:    Vaccine Type: Fluvax 3+    Site: left deltoid    Mfr: GlaxoSmithKline    Dose: 0.5 ml    Route: IM    Given by: Hale Drone CMA    Exp. Date: 01/11/2011    Lot #: [id number]    VIS given: 02/05/10 version given September 02, 2010.   Prevention & Chronic Care Immunizations   Influenza vaccine: Fluvax 3+  (09/02/2010)    Tetanus booster: 07/15/2003: historical  per pt    Pneumococcal vaccine: Not documented    H. zoster vaccine: Not documented  Colorectal Screening   Hemoccult: Not documented    Colonoscopy: Not documented  Other Screening   PSA: 1.93  (09/21/2009)   PSA ordered.   Smoking status: quit  (09/02/2010)  Lipids   Total Cholesterol: 148  (09/21/2009)   LDL: 88  (09/21/2009)   LDL Direct: Not documented   HDL: 43  (09/21/2009)   Triglycerides: 84  (09/21/2009)  Hypertension   Last Blood Pressure: 154 / 80  (09/02/2010)   Serum creatinine: 1.11  (09/21/2009)   Serum potassium 4.0  (09/21/2009) CMP ordered   Self-Management Support :    Hypertension self-management support: Not documented   Nursing Instructions: Give Flu vaccine today   Laboratory Results   Urine Tests  Date/Time Received: September 02, 2010 10:38 AM   Routine Urinalysis   Color: yellow Glucose: negative   (Normal Range: Negative) Bilirubin: negative   (Normal Range: Negative) Ketone: negative   (Normal Range: Negative) Spec. Gravity: 1.020   (Normal Range: 1.003-1.035) Blood: moderate   (Normal Range: Negative) pH: 5.0   (Normal Range: 5.0-8.0) Protein: negative   (Normal Range: Negative) Urobilinogen: 0.2   (Normal Range: 0-1) Nitrite: negative   (Normal Range: Negative) Leukocyte Esterace: negative   (Normal Range: Negative)    Date/Time Received: September 02, 2010 11:10 AM   Other Tests  Rapid HIV: negative

## 2010-11-28 ENCOUNTER — Encounter (HOSPITAL_BASED_OUTPATIENT_CLINIC_OR_DEPARTMENT_OTHER): Payer: Medicare Other | Admitting: Oncology

## 2010-11-28 ENCOUNTER — Other Ambulatory Visit (HOSPITAL_COMMUNITY): Payer: Self-pay | Admitting: Oncology

## 2010-11-28 DIAGNOSIS — D72829 Elevated white blood cell count, unspecified: Secondary | ICD-10-CM

## 2010-11-28 DIAGNOSIS — F17200 Nicotine dependence, unspecified, uncomplicated: Secondary | ICD-10-CM

## 2010-11-28 DIAGNOSIS — K7689 Other specified diseases of liver: Secondary | ICD-10-CM

## 2010-11-28 DIAGNOSIS — J984 Other disorders of lung: Secondary | ICD-10-CM

## 2010-11-28 DIAGNOSIS — R918 Other nonspecific abnormal finding of lung field: Secondary | ICD-10-CM

## 2010-11-28 LAB — CBC WITH DIFFERENTIAL/PLATELET
BASO%: 0.8 % (ref 0.0–2.0)
HCT: 39.1 % (ref 38.4–49.9)
MCHC: 34.8 g/dL (ref 32.0–36.0)
MONO#: 0.9 10*3/uL (ref 0.1–0.9)
RBC: 4.09 10*6/uL — ABNORMAL LOW (ref 4.20–5.82)
WBC: 10.8 10*3/uL — ABNORMAL HIGH (ref 4.0–10.3)
lymph#: 2.6 10*3/uL (ref 0.9–3.3)

## 2010-11-28 LAB — COMPREHENSIVE METABOLIC PANEL
ALT: 38 U/L (ref 0–53)
CO2: 22 mEq/L (ref 19–32)
Calcium: 9.6 mg/dL (ref 8.4–10.5)
Chloride: 101 mEq/L (ref 96–112)
Sodium: 137 mEq/L (ref 135–145)
Total Protein: 8.1 g/dL (ref 6.0–8.3)

## 2010-11-28 LAB — LACTATE DEHYDROGENASE: LDH: 174 U/L (ref 94–250)

## 2011-05-07 ENCOUNTER — Encounter: Payer: Self-pay | Admitting: Oncology

## 2011-05-21 NOTE — Progress Notes (Signed)
This encounter was created in error - please disregard.

## 2011-05-27 ENCOUNTER — Other Ambulatory Visit: Payer: Self-pay

## 2011-05-27 ENCOUNTER — Other Ambulatory Visit (HOSPITAL_COMMUNITY): Payer: Medicare Other

## 2011-06-03 ENCOUNTER — Telehealth: Payer: Self-pay | Admitting: Oncology

## 2011-06-03 NOTE — Telephone Encounter (Signed)
S/w the pt's friend Arna Medici regarding the pt's ct scan appt for nov and the lab and md appts for jan 2013

## 2011-06-12 ENCOUNTER — Ambulatory Visit (HOSPITAL_COMMUNITY)
Admission: RE | Admit: 2011-06-12 | Discharge: 2011-06-12 | Disposition: A | Discharge status: Home / Self Care | Payer: Medicare Other | Source: Ambulatory Visit | Attending: Oncology | Admitting: Oncology

## 2011-06-12 DIAGNOSIS — R918 Other nonspecific abnormal finding of lung field: Secondary | ICD-10-CM

## 2011-06-12 DIAGNOSIS — R911 Solitary pulmonary nodule: Secondary | ICD-10-CM | POA: Insufficient documentation

## 2011-06-12 NOTE — Progress Notes (Signed)
Quick Note:  Please notify patient and call/fax these results to patient's doctors. ______ 

## 2011-06-13 NOTE — Progress Notes (Signed)
Quick Note:  Please notify patient and call/fax these results to patient's doctors. ______ 

## 2011-06-16 ENCOUNTER — Telehealth: Payer: Self-pay

## 2011-06-16 NOTE — Telephone Encounter (Signed)
S/w Jorene Minors that CT chest looks OK per Dr Arline Asp, confirmed f/u appt on 07/24/11. Will fax CT to Dr Delrae Alfred. Ms. Milas Gain stated she would let pt know

## 2011-07-23 ENCOUNTER — Encounter: Payer: Self-pay | Admitting: Medical Oncology

## 2011-07-24 ENCOUNTER — Other Ambulatory Visit (HOSPITAL_COMMUNITY): Payer: Self-pay | Admitting: Oncology

## 2011-07-24 ENCOUNTER — Ambulatory Visit (HOSPITAL_BASED_OUTPATIENT_CLINIC_OR_DEPARTMENT_OTHER): Payer: Medicare Other | Admitting: Oncology

## 2011-07-24 ENCOUNTER — Encounter: Payer: Self-pay | Admitting: Oncology

## 2011-07-24 ENCOUNTER — Other Ambulatory Visit: Payer: Medicare Other | Admitting: Lab

## 2011-07-24 DIAGNOSIS — R918 Other nonspecific abnormal finding of lung field: Secondary | ICD-10-CM

## 2011-07-24 DIAGNOSIS — D72829 Elevated white blood cell count, unspecified: Secondary | ICD-10-CM

## 2011-07-24 DIAGNOSIS — D472 Monoclonal gammopathy: Secondary | ICD-10-CM

## 2011-07-24 DIAGNOSIS — F17200 Nicotine dependence, unspecified, uncomplicated: Secondary | ICD-10-CM

## 2011-07-24 DIAGNOSIS — J984 Other disorders of lung: Secondary | ICD-10-CM

## 2011-07-24 DIAGNOSIS — K7689 Other specified diseases of liver: Secondary | ICD-10-CM

## 2011-07-24 LAB — CBC WITH DIFFERENTIAL/PLATELET
Eosinophils Absolute: 0.1 10*3/uL (ref 0.0–0.5)
HCT: 43.3 % (ref 38.4–49.9)
LYMPH%: 25.5 % (ref 14.0–49.0)
MONO#: 0.6 10*3/uL (ref 0.1–0.9)
NEUT#: 6.7 10*3/uL — ABNORMAL HIGH (ref 1.5–6.5)
NEUT%: 67.1 % (ref 39.0–75.0)
Platelets: 263 10*3/uL (ref 140–400)
RBC: 4.51 10*6/uL (ref 4.20–5.82)
WBC: 10 10*3/uL (ref 4.0–10.3)

## 2011-07-24 LAB — COMPREHENSIVE METABOLIC PANEL
Albumin: 4.3 g/dL (ref 3.5–5.2)
Alkaline Phosphatase: 62 U/L (ref 39–117)
BUN: 15 mg/dL (ref 6–23)
Calcium: 10.1 mg/dL (ref 8.4–10.5)
Creatinine, Ser: 1.17 mg/dL (ref 0.50–1.35)
Glucose, Bld: 141 mg/dL — ABNORMAL HIGH (ref 70–99)
Potassium: 3.5 mEq/L (ref 3.5–5.3)

## 2011-07-24 NOTE — Progress Notes (Signed)
This office note has been dictated.  #[id number]
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
## 2011-11-19 ENCOUNTER — Telehealth: Payer: Self-pay | Admitting: Oncology

## 2011-11-19 NOTE — Telephone Encounter (Signed)
called pt to move appt to allow time for chemo pt aom

## 2011-12-01 ENCOUNTER — Ambulatory Visit (HOSPITAL_COMMUNITY)
Admission: RE | Admit: 2011-12-01 | Discharge: 2011-12-01 | Disposition: A | Discharge status: Home / Self Care | Payer: Medicare Other | Source: Ambulatory Visit | Attending: Oncology | Admitting: Oncology

## 2011-12-01 DIAGNOSIS — J984 Other disorders of lung: Secondary | ICD-10-CM | POA: Insufficient documentation

## 2011-12-01 DIAGNOSIS — J438 Other emphysema: Secondary | ICD-10-CM | POA: Insufficient documentation

## 2011-12-01 DIAGNOSIS — K7689 Other specified diseases of liver: Secondary | ICD-10-CM | POA: Insufficient documentation

## 2011-12-01 DIAGNOSIS — R918 Other nonspecific abnormal finding of lung field: Secondary | ICD-10-CM | POA: Insufficient documentation

## 2011-12-01 DIAGNOSIS — N289 Disorder of kidney and ureter, unspecified: Secondary | ICD-10-CM | POA: Insufficient documentation

## 2011-12-11 ENCOUNTER — Encounter: Payer: Self-pay | Admitting: Oncology

## 2011-12-11 NOTE — Progress Notes (Signed)
CT scan of the chest without IV contrast was carried out on 12/01/2011 and showed no changes compared with the CT scans of 12/07/2009. No further imaging studies are indicated at this time.

## 2011-12-25 ENCOUNTER — Telehealth: Payer: Self-pay | Admitting: Oncology

## 2011-12-25 NOTE — Telephone Encounter (Signed)
called Arna Medici and she has noted the change in his appt    aom

## 2011-12-30 ENCOUNTER — Ambulatory Visit: Payer: Medicare Other | Admitting: Oncology

## 2011-12-30 ENCOUNTER — Other Ambulatory Visit: Payer: Medicare Other | Admitting: Lab

## 2012-01-12 ENCOUNTER — Other Ambulatory Visit: Payer: Medicare Other | Admitting: Lab

## 2012-01-12 ENCOUNTER — Encounter: Payer: Self-pay | Admitting: Oncology

## 2012-01-12 ENCOUNTER — Ambulatory Visit (HOSPITAL_BASED_OUTPATIENT_CLINIC_OR_DEPARTMENT_OTHER): Payer: Medicare Other | Admitting: Oncology

## 2012-01-12 VITALS — BP 146/76 | HR 104 | Temp 97.4°F | Ht 67.0 in | Wt 171.4 lb

## 2012-01-12 DIAGNOSIS — D72829 Elevated white blood cell count, unspecified: Secondary | ICD-10-CM

## 2012-01-12 DIAGNOSIS — D472 Monoclonal gammopathy: Secondary | ICD-10-CM

## 2012-01-12 DIAGNOSIS — R911 Solitary pulmonary nodule: Secondary | ICD-10-CM

## 2012-01-12 DIAGNOSIS — B1920 Unspecified viral hepatitis C without hepatic coma: Secondary | ICD-10-CM

## 2012-01-12 DIAGNOSIS — R918 Other nonspecific abnormal finding of lung field: Secondary | ICD-10-CM

## 2012-01-12 LAB — CBC WITH DIFFERENTIAL/PLATELET
Eosinophils Absolute: 0.1 10*3/uL (ref 0.0–0.5)
HGB: 13 g/dL (ref 13.0–17.1)
MONO#: 0.8 10*3/uL (ref 0.1–0.9)
NEUT#: 7 10*3/uL — ABNORMAL HIGH (ref 1.5–6.5)
Platelets: 273 10*3/uL (ref 140–400)
RBC: 3.89 10*6/uL — ABNORMAL LOW (ref 4.20–5.82)
RDW: 12.7 % (ref 11.0–14.6)
WBC: 10.7 10*3/uL — ABNORMAL HIGH (ref 4.0–10.3)

## 2012-01-12 NOTE — Progress Notes (Signed)
This office note has been dictated.  #[id number]
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
## 2012-01-13 LAB — COMPREHENSIVE METABOLIC PANEL
ALT: 27 U/L (ref 0–53)
Albumin: 4.1 g/dL (ref 3.5–5.2)
CO2: 24 mEq/L (ref 19–32)
Calcium: 9.6 mg/dL (ref 8.4–10.5)
Chloride: 102 mEq/L (ref 96–112)
Sodium: 138 mEq/L (ref 135–145)
Total Protein: 7.7 g/dL (ref 6.0–8.3)

## 2012-01-13 LAB — LACTATE DEHYDROGENASE: LDH: 138 U/L (ref 94–250)

## 2012-01-13 LAB — IGG, IGA, IGM
IgG (Immunoglobin G), Serum: 2710 mg/dL — ABNORMAL HIGH (ref 650–1600)
IgM, Serum: 57 mg/dL (ref 41–251)

## 2013-02-28 ENCOUNTER — Other Ambulatory Visit: Payer: Self-pay | Admitting: Internal Medicine

## 2013-02-28 DIAGNOSIS — R7989 Other specified abnormal findings of blood chemistry: Secondary | ICD-10-CM

## 2013-03-22 ENCOUNTER — Encounter (HOSPITAL_COMMUNITY)
Admission: RE | Admit: 2013-03-22 | Discharge: 2013-03-22 | Disposition: A | Discharge status: Home / Self Care | Payer: Medicare Other | Source: Ambulatory Visit | Attending: Internal Medicine | Admitting: Internal Medicine

## 2013-03-22 DIAGNOSIS — R946 Abnormal results of thyroid function studies: Secondary | ICD-10-CM | POA: Insufficient documentation

## 2013-03-22 DIAGNOSIS — R7989 Other specified abnormal findings of blood chemistry: Secondary | ICD-10-CM

## 2013-03-23 ENCOUNTER — Encounter (HOSPITAL_COMMUNITY)
Admission: RE | Admit: 2013-03-23 | Discharge: 2013-03-23 | Disposition: A | Discharge status: Home / Self Care | Payer: Medicare Other | Source: Ambulatory Visit | Attending: Internal Medicine | Admitting: Internal Medicine

## 2013-03-23 ENCOUNTER — Encounter (HOSPITAL_COMMUNITY): Payer: Self-pay

## 2013-03-23 MED ORDER — SODIUM IODIDE I 131 CAPSULE
10.5000 | Freq: Once | INTRAVENOUS | Status: AC | PRN
  Administered 2013-03-23: 10.5 via ORAL

## 2013-03-23 MED ORDER — SODIUM PERTECHNETATE TC 99M INJECTION
10.0000 | Freq: Once | INTRAVENOUS | Status: AC | PRN
  Administered 2013-03-23: 10 via INTRAVENOUS

## 2013-10-17 IMAGING — CT CT CHEST W/O CM
2 of 4 series · 15 of 36 positions shown, 18 images · non-contrast
Comparison: CT scans from [DATE] and 06/07/2010.

CLINICAL DATA: Follow-up lung nodules.

CT CHEST WITHOUT CONTRAST
TECHNIQUE: Multidetector CT imaging of the chest was performed
following the standard protocol without IV contrast.

[Series 2: chest w/o st · axial · non-contrast · 0.70mm/px · z∈[-316,-36]mm · 12 of 68 slices shown, 15 images]
[im 6/68  mediastinal]
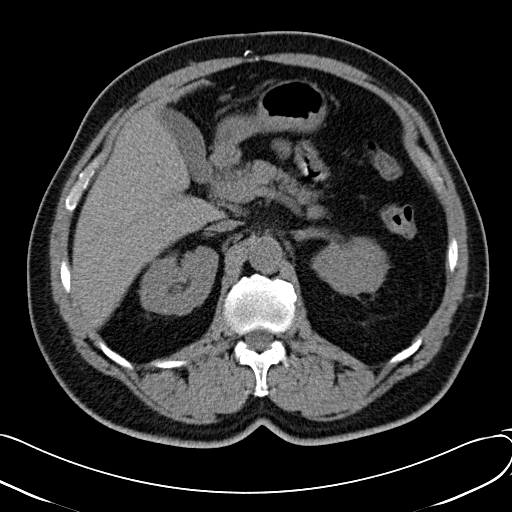
[im 6/68  lung]
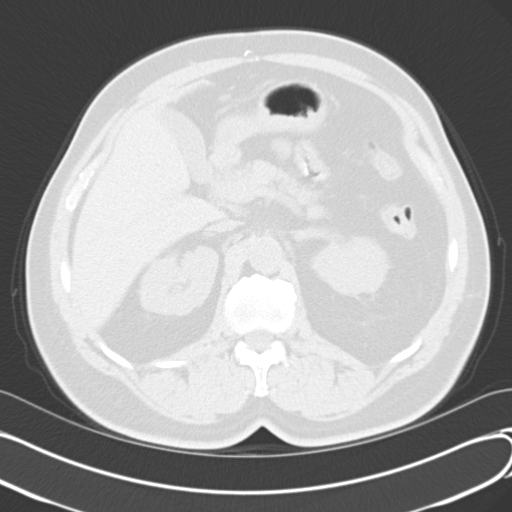
[im 11/68  lung]
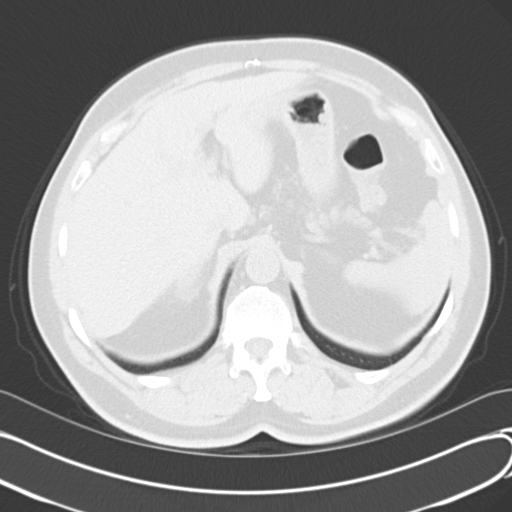
[im 16/68  lung]
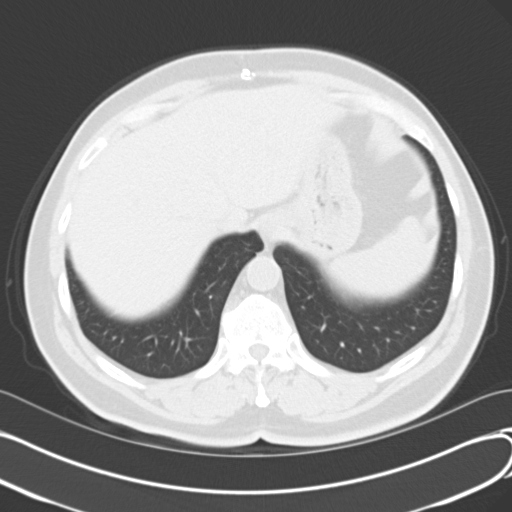
[im 21/68  lung]
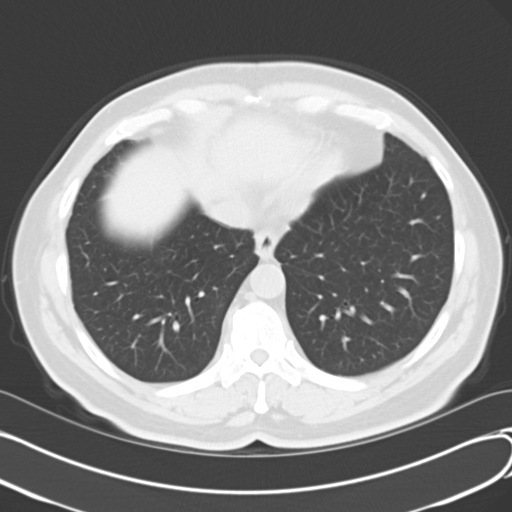
[im 26/68  mediastinal]
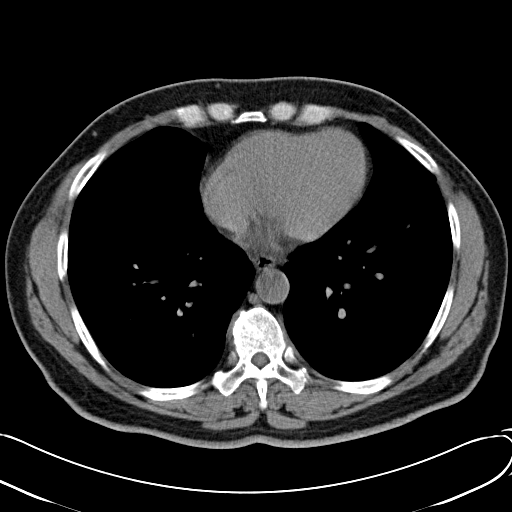
[im 26/68  lung]
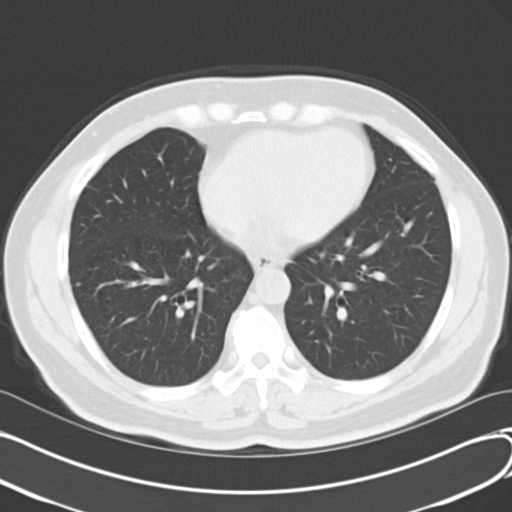
[im 31/68  lung]
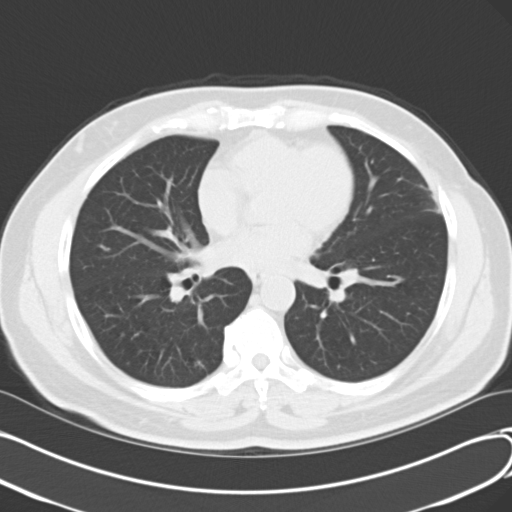
[im 37/68  lung]
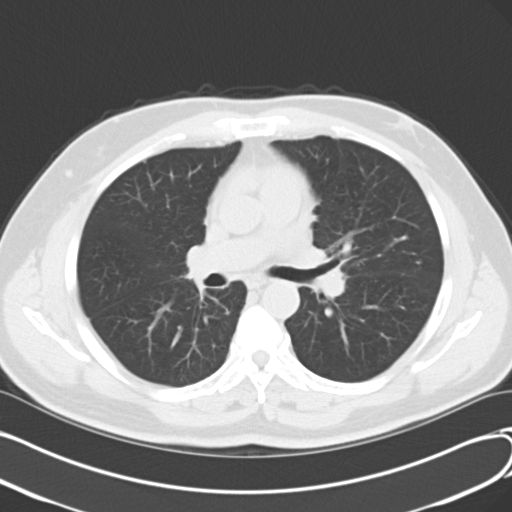
[im 42/68  lung]
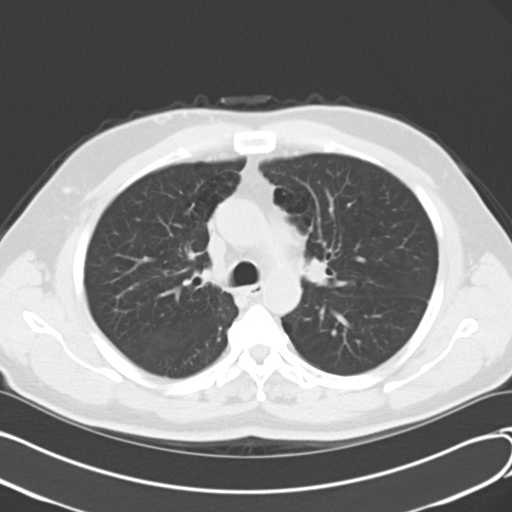
[im 47/68  mediastinal]
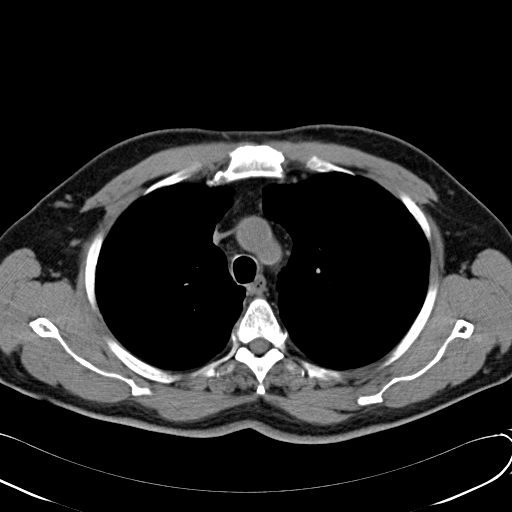
[im 47/68  lung]
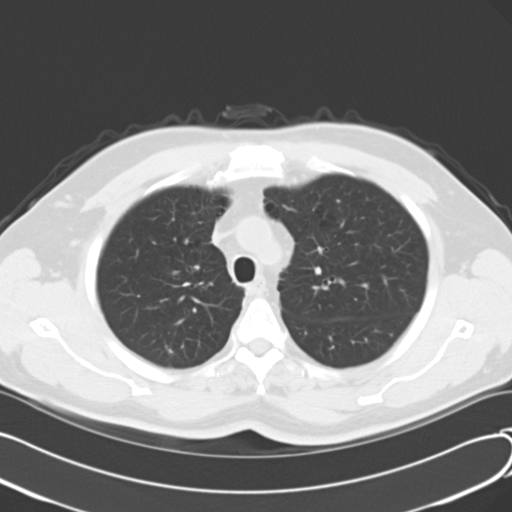
[im 52/68  lung]
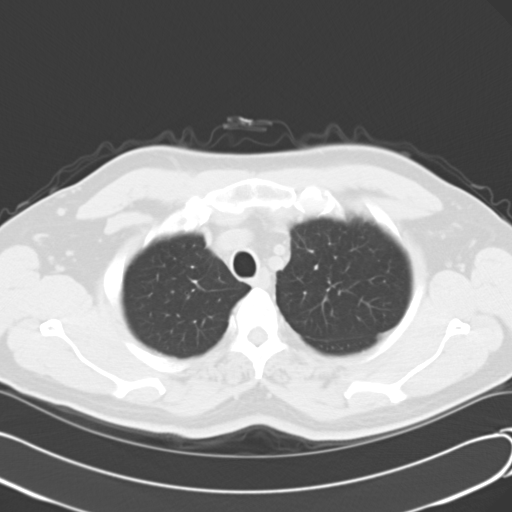
[im 57/68  lung]
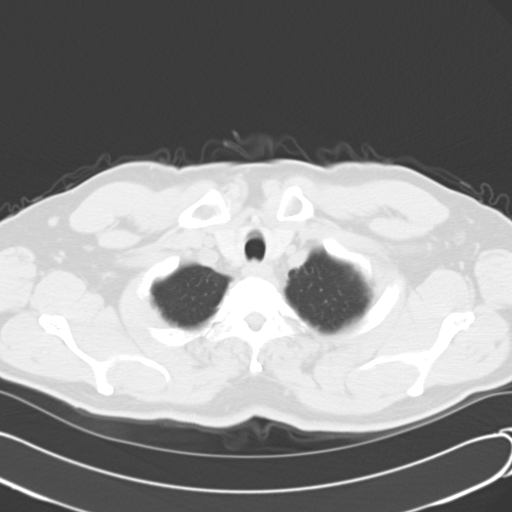
[im 62/68  lung]
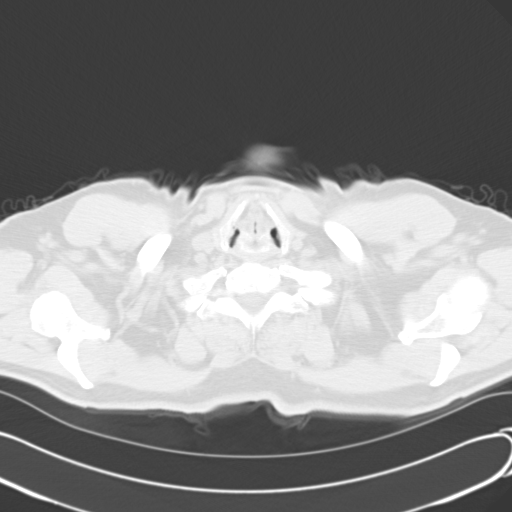

[Series 602: <mpr thick range> · coronal · 0.70mm/px · 3 of 90 slices shown]
[im 18/90  lung]
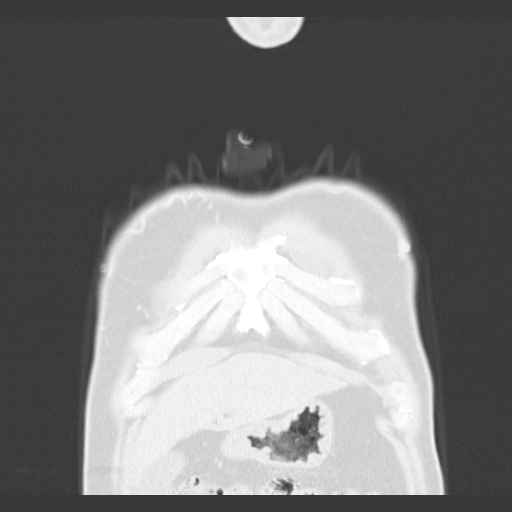
[im 36/90  lung]
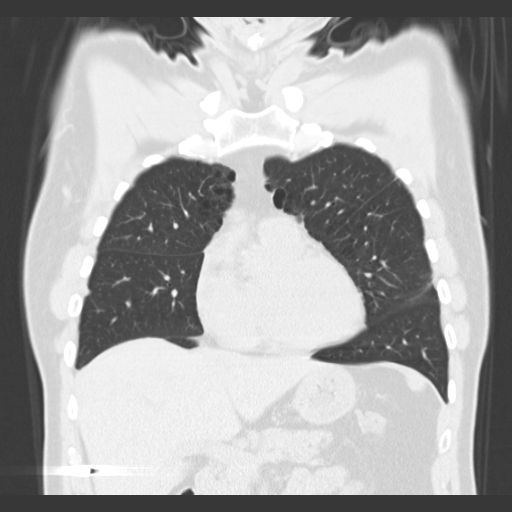
[im 54/90  lung]
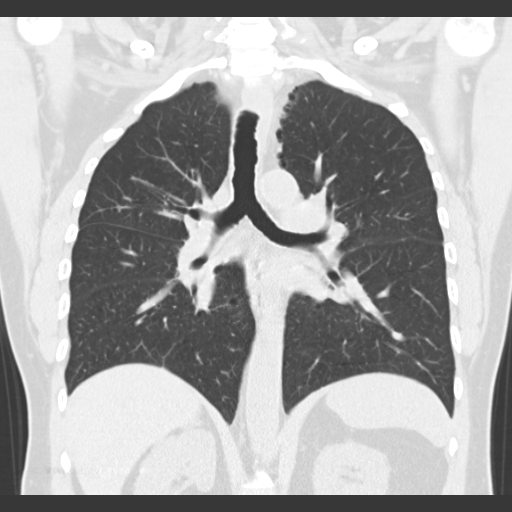

[15 of 36 positions shown; findings below may reference images not displayed]

FINDINGS: Multiple tiny subpleural pulmonary nodules are unchanged.
These are likely intrapulmonary lymph nodes.  No acute pulmonary
findings.  No new lesions.  Stable changes of early para septal
emphysema.

The heart is normal in size.  No pericardial effusion.  No
mediastinal or hilar lymphadenopathy.  The aorta is normal in
caliber.  The esophagus is grossly normal.
IMPRESSION: Stable tiny subpleural pulmonary nodules, likely benign
intrapulmonary lymph nodes.  Recommend a follow-up noncontrast
original chest CT.

## 2018-07-06 ENCOUNTER — Encounter (HOSPITAL_BASED_OUTPATIENT_CLINIC_OR_DEPARTMENT_OTHER): Payer: Self-pay | Admitting: Emergency Medicine

## 2018-07-06 ENCOUNTER — Other Ambulatory Visit: Payer: Self-pay

## 2018-07-06 ENCOUNTER — Emergency Department (HOSPITAL_BASED_OUTPATIENT_CLINIC_OR_DEPARTMENT_OTHER)
Admission: EM | Admit: 2018-07-06 | Discharge: 2018-07-07 | Disposition: A | Discharge status: Home / Self Care | Payer: Medicare HMO | Attending: Emergency Medicine | Admitting: Emergency Medicine

## 2018-07-06 DIAGNOSIS — F13230 Sedative, hypnotic or anxiolytic dependence with withdrawal, uncomplicated: Secondary | ICD-10-CM | POA: Insufficient documentation

## 2018-07-06 DIAGNOSIS — Z79899 Other long term (current) drug therapy: Secondary | ICD-10-CM | POA: Insufficient documentation

## 2018-07-06 DIAGNOSIS — Z87891 Personal history of nicotine dependence: Secondary | ICD-10-CM | POA: Insufficient documentation

## 2018-07-06 DIAGNOSIS — F13930 Sedative, hypnotic or anxiolytic use, unspecified with withdrawal, uncomplicated: Secondary | ICD-10-CM

## 2018-07-06 DIAGNOSIS — I10 Essential (primary) hypertension: Secondary | ICD-10-CM | POA: Diagnosis not present

## 2018-07-06 DIAGNOSIS — R419 Unspecified symptoms and signs involving cognitive functions and awareness: Secondary | ICD-10-CM | POA: Diagnosis present

## 2018-07-06 NOTE — ED Triage Notes (Signed)
Per family pt is been having withdrawal symptoms from Klonopin, per pt PCP stopped his medication and he was seen on UC yesterday where they started the medication, but half the dose, per pt is very shaky and anxious today.

## 2018-07-07 DIAGNOSIS — F13230 Sedative, hypnotic or anxiolytic dependence with withdrawal, uncomplicated: Secondary | ICD-10-CM | POA: Diagnosis not present

## 2018-07-07 MED ORDER — DIAZEPAM 5 MG PO TABS
10.0000 mg | ORAL_TABLET | Freq: Once | ORAL | Status: AC
  Administered 2018-07-07: 10 mg via ORAL
  Filled 2018-07-07: qty 2

## 2018-07-07 MED ORDER — DIAZEPAM 5 MG/ML IJ SOLN: 10.0000 mg | Freq: Once | INTRAMUSCULAR | Status: DC
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
## 2018-07-07 NOTE — ED Notes (Signed)
ED Provider at bedside.
